# Patient Record
Sex: Female | Born: 1985 | Race: White | Hispanic: No | Marital: Single | Smoking: Current every day smoker
Health system: Southern US, Community
[De-identification: ages and names within clinical notes are randomized; demographics above are authoritative.]

## PROBLEM LIST (undated history)

## (undated) DIAGNOSIS — N189 Chronic kidney disease, unspecified: Secondary | ICD-10-CM

## (undated) DIAGNOSIS — L659 Nonscarring hair loss, unspecified: Secondary | ICD-10-CM

## (undated) HISTORY — PX: TUBAL LIGATION: SHX77

---

## 2005-09-03 ENCOUNTER — Emergency Department: Payer: Self-pay | Admitting: Emergency Medicine

## 2005-10-06 ENCOUNTER — Observation Stay: Payer: Self-pay

## 2005-10-10 ENCOUNTER — Observation Stay: Payer: Self-pay | Admitting: Obstetrics & Gynecology

## 2005-10-26 ENCOUNTER — Inpatient Hospital Stay: Payer: Self-pay | Admitting: Obstetrics & Gynecology

## 2006-03-02 ENCOUNTER — Emergency Department: Payer: Self-pay | Admitting: General Practice

## 2010-05-03 ENCOUNTER — Emergency Department: Payer: Self-pay | Admitting: Emergency Medicine

## 2012-04-29 ENCOUNTER — Emergency Department: Payer: Self-pay | Admitting: Emergency Medicine

## 2012-04-29 LAB — URINALYSIS, COMPLETE
Bilirubin,UR: NEGATIVE
Ketone: NEGATIVE
Ph: 6 (ref 4.5–8.0)
Specific Gravity: 1.013 (ref 1.003–1.030)

## 2012-04-29 LAB — COMPREHENSIVE METABOLIC PANEL WITH GFR
Albumin: 4.4 g/dL
Alkaline Phosphatase: 94 U/L
Anion Gap: 7
BUN: 11 mg/dL
Bilirubin,Total: 0.4 mg/dL
Calcium, Total: 8.7 mg/dL
Chloride: 109 mmol/L — ABNORMAL HIGH
Co2: 25 mmol/L
Creatinine: 0.83 mg/dL
EGFR (African American): >60
EGFR (Non-African Amer.): >60
Glucose: 82 mg/dL
Osmolality: 280
Potassium: 3.5 mmol/L
SGOT(AST): 22 U/L
SGPT (ALT): 23 U/L
Sodium: 141 mmol/L
Total Protein: 7.3 g/dL

## 2012-04-29 LAB — CBC
HCT: 43.3 % (ref 35.0–47.0)
HGB: 14.3 g/dL (ref 12.0–16.0)
MCH: 29.9 pg (ref 26.0–34.0)
MCHC: 33 g/dL (ref 32.0–36.0)
MCV: 91 fL (ref 80–100)
RBC: 4.77 10*6/uL (ref 3.80–5.20)

## 2014-01-11 ENCOUNTER — Emergency Department: Payer: Self-pay | Admitting: Emergency Medicine

## 2015-01-05 ENCOUNTER — Emergency Department: Payer: Self-pay | Admitting: Emergency Medicine

## 2015-02-13 ENCOUNTER — Ambulatory Visit
Admit: 2015-02-13 | Disposition: A | Discharge status: Home / Self Care | Payer: Self-pay | Attending: Obstetrics and Gynecology | Admitting: Obstetrics and Gynecology

## 2016-08-05 ENCOUNTER — Encounter: Payer: Self-pay | Admitting: Emergency Medicine

## 2016-08-05 ENCOUNTER — Emergency Department
Admission: EM | Admit: 2016-08-05 | Discharge: 2016-08-05 | Disposition: A | Discharge status: Home / Self Care | Payer: No Typology Code available for payment source | Attending: Emergency Medicine | Admitting: Emergency Medicine

## 2016-08-05 DIAGNOSIS — Y999 Unspecified external cause status: Secondary | ICD-10-CM | POA: Insufficient documentation

## 2016-08-05 DIAGNOSIS — Y9389 Activity, other specified: Secondary | ICD-10-CM | POA: Diagnosis not present

## 2016-08-05 DIAGNOSIS — N390 Urinary tract infection, site not specified: Secondary | ICD-10-CM

## 2016-08-05 DIAGNOSIS — F17200 Nicotine dependence, unspecified, uncomplicated: Secondary | ICD-10-CM | POA: Diagnosis not present

## 2016-08-05 DIAGNOSIS — Y92410 Unspecified street and highway as the place of occurrence of the external cause: Secondary | ICD-10-CM | POA: Diagnosis not present

## 2016-08-05 DIAGNOSIS — S39012A Strain of muscle, fascia and tendon of lower back, initial encounter: Secondary | ICD-10-CM | POA: Diagnosis not present

## 2016-08-05 DIAGNOSIS — R319 Hematuria, unspecified: Secondary | ICD-10-CM

## 2016-08-05 DIAGNOSIS — S3992XA Unspecified injury of lower back, initial encounter: Secondary | ICD-10-CM | POA: Diagnosis present

## 2016-08-05 LAB — URINALYSIS COMPLETE WITH MICROSCOPIC (ARMC ONLY)
Bilirubin Urine: NEGATIVE
Glucose, UA: NEGATIVE mg/dL
KETONES UR: NEGATIVE mg/dL
NITRITE: POSITIVE — AB
PH: 6 (ref 5.0–8.0)
Protein, ur: 100 mg/dL — AB
SPECIFIC GRAVITY, URINE: 1.012 (ref 1.005–1.030)

## 2016-08-05 MED ORDER — NITROFURANTOIN MONOHYD MACRO 100 MG PO CAPS: 100.0000 mg | ORAL_CAPSULE | Freq: Two times a day (BID) | ORAL | 0 refills | Status: AC
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
## 2017-07-04 ENCOUNTER — Emergency Department
Admission: EM | Admit: 2017-07-04 | Discharge: 2017-07-04 | Disposition: A | Discharge status: Home / Self Care | Payer: BLUE CROSS/BLUE SHIELD | Attending: Emergency Medicine | Admitting: Emergency Medicine

## 2017-07-04 ENCOUNTER — Encounter: Payer: Self-pay | Admitting: *Deleted

## 2017-07-04 DIAGNOSIS — R0981 Nasal congestion: Secondary | ICD-10-CM

## 2017-07-04 DIAGNOSIS — F17210 Nicotine dependence, cigarettes, uncomplicated: Secondary | ICD-10-CM | POA: Insufficient documentation

## 2017-07-04 DIAGNOSIS — J069 Acute upper respiratory infection, unspecified: Secondary | ICD-10-CM | POA: Insufficient documentation

## 2017-07-04 DIAGNOSIS — N189 Chronic kidney disease, unspecified: Secondary | ICD-10-CM | POA: Insufficient documentation

## 2017-07-04 HISTORY — DX: Chronic kidney disease, unspecified: N18.9

## 2017-07-04 MED ORDER — FLUTICASONE PROPIONATE 50 MCG/ACT NA SUSP: 1.0000 | Freq: Every day | NASAL | 0 refills | Status: AC

## 2017-07-04 MED ORDER — PSEUDOEPH-BROMPHEN-DM 30-2-10 MG/5ML PO SYRP: 5.0000 mL | ORAL_SOLUTION | Freq: Four times a day (QID) | ORAL | 0 refills | Status: AC | PRN

## 2017-07-04 NOTE — Discharge Instructions (Signed)
Please use Flonase as needed for nasal congestion. Please drink lots of fluids. Take cough medication, dextromethorphan with pseudoephedrine and brompheniramine. Return to the emergency department for any worsening cough, pain, fevers, urgent changes in her health.

## 2017-07-04 NOTE — ED Triage Notes (Signed)
PT to ED reporting congestion beginning yesterday and developing into generalized body aches today. PT reports joint aching and chest pains when coughing. PT reports coughing up clear mucus and also reports clear nasal drainage. Pt denies having checked temp at home but reports fever like symptoms. Pt also reports nausea without vomiting or diarrhea and generalized weakness x 2 days as well.

## 2017-07-04 NOTE — ED Provider Notes (Signed)
[facility]-EMERGENCY DEPARTMENT Provider Note   CSN: [id number] Arrival date & time: 07/04/17  1712     History   Chief Complaint Chief Complaint  Patient presents with  . Nasal Congestion  . Generalized Body Aches    HPI Victoria Mercer is a 31 y.o. female presents to the emergency department for evaluation of nasal congestion, body aches, nonproductive cough. Symptoms have been present for 1 day. She denies any chest pain or shortness of breath. No nausea vomiting abdominal pain or diarrhea. She has not taken any medications for her symptoms. No Tylenol or ibuprofen. She is tolerating by mouth well. She denies any sore throat, rashes, neck pain.  HPI  Past Medical History:  Diagnosis Date  . Chronic kidney disease     There are no active problems to display for this patient.   History reviewed. No pertinent surgical history.  OB History    No data available       Home Medications    Prior to Admission medications   Medication Sig Start Date End Date Taking? Authorizing Provider  brompheniramine-pseudoephedrine-DM 30-2-10 MG/5ML syrup Take 5 mLs by mouth 4 (four) times daily as needed. 07/04/17   Evon Slack, PA-C  fluticasone (FLONASE) 50 MCG/ACT nasal spray Place 1 spray into both nostrils daily. 07/04/17 07/04/18  Evon Slack, PA-C    Family History History reviewed. No pertinent family history.  Social History Social History  Substance Use Topics  . Smoking status: Current Every Day Smoker    Packs/day: 0.50    Types: Cigarettes  . Smokeless tobacco: Never Used  . Alcohol use No     Allergies   Patient has no known allergies.   Review of Systems Review of Systems  Constitutional: Negative for fever.  HENT: Positive for congestion, rhinorrhea, sinus pain and sinus pressure. Negative for ear pain, facial swelling, sore throat and trouble swallowing.   Respiratory: Positive for cough. Negative for shortness of breath.   Cardiovascular:  Negative for chest pain.  Gastrointestinal: Negative for abdominal pain, diarrhea, nausea and vomiting.  Genitourinary: Negative for difficulty urinating, dysuria and urgency.  Musculoskeletal: Negative for back pain and myalgias.  Skin: Negative for rash.  Neurological: Negative for dizziness and headaches.     Physical Exam Updated Vital Signs BP (!) 133/95 (BP Location: Left Arm)   Pulse (!) 101   Temp 99.1 F (37.3 C) (Oral)   Resp 16   Ht 5' (1.524 m)   Wt 49.9 kg (110 lb)   LMP 07/04/2017   SpO2 99%   BMI 21.48 kg/m   Physical Exam  Constitutional: She is oriented to person, place, and time. She appears well-developed and well-nourished. No distress.  HENT:  Head: Normocephalic and atraumatic.  Right Ear: External ear normal.  Left Ear: External ear normal.  Nose: Nose normal.  Mouth/Throat: Oropharynx is clear and moist. No oropharyngeal exudate.  Mild pharyngeal erythema with no exudates. Uvula is midline. No sign of peritonsillar abscess with midline shift. Positive tenderness along bilateral maxillary sinuses with no frontal sinus tenderness to palpation or percussion.  Eyes: Conjunctivae are normal. Right eye exhibits no discharge. Left eye exhibits no discharge.  Neck: Normal range of motion.  Cardiovascular: Normal rate.   Pulmonary/Chest: No respiratory distress.  Abdominal: Soft. She exhibits no mass. There is no tenderness. There is no guarding.  Musculoskeletal: Normal range of motion. She exhibits no deformity.  Lymphadenopathy:    She has cervical adenopathy.  Neurological: She is alert  and oriented to person, place, and time. She has normal reflexes.  Skin: Skin is warm and dry.  Psychiatric: She has a normal mood and affect. Her behavior is normal. Thought content normal.     ED Treatments / Results  Labs (all labs ordered are listed, but only abnormal results are displayed) Labs Reviewed - No data to display  EKG  EKG Interpretation None        Radiology No results found.  Procedures Procedures (including critical care time)  Medications Ordered in ED Medications - No data to display   Initial Impression / Assessment and Plan / ED Course  I have reviewed the triage vital signs and the nursing notes.  Pertinent labs & imaging results that were available during my care of the patient were reviewed by me and considered in my medical decision making (see chart for details).     31 year old female with upper respiratory infection. She complains of dry cough, nasal congestion. She will start Flonase. She is given Bromfed for cough. She will increase fluids. She will take Tylenol as needed for body aches and fevers. She will return to the ER for any worsening fevers, shortness of breath, worsening symptoms or changes in her health.  Final Clinical Impressions(s) / ED Diagnoses   Final diagnoses:  Viral upper respiratory tract infection  Nasal congestion    New Prescriptions New Prescriptions   BROMPHENIRAMINE-PSEUDOEPHEDRINE-DM 30-2-10 MG/5ML SYRUP    Take 5 mLs by mouth 4 (four) times daily as needed.   FLUTICASONE (FLONASE) 50 MCG/ACT NASAL SPRAY    Place 1 spray into both nostrils daily.     Evon SlackGaines, Thomas C, PA-C 07/04/17 1803    Rockne MenghiniNorman, Anne-Caroline, MD 07/04/17 2136

## 2017-11-16 ENCOUNTER — Other Ambulatory Visit: Payer: Self-pay

## 2017-11-16 ENCOUNTER — Emergency Department: Payer: BLUE CROSS/BLUE SHIELD

## 2017-11-16 ENCOUNTER — Emergency Department
Admission: EM | Admit: 2017-11-16 | Discharge: 2017-11-16 | Disposition: A | Discharge status: Home / Self Care | Payer: BLUE CROSS/BLUE SHIELD | Attending: Emergency Medicine | Admitting: Emergency Medicine

## 2017-11-16 DIAGNOSIS — N189 Chronic kidney disease, unspecified: Secondary | ICD-10-CM | POA: Insufficient documentation

## 2017-11-16 DIAGNOSIS — N12 Tubulo-interstitial nephritis, not specified as acute or chronic: Secondary | ICD-10-CM

## 2017-11-16 DIAGNOSIS — F17210 Nicotine dependence, cigarettes, uncomplicated: Secondary | ICD-10-CM | POA: Diagnosis not present

## 2017-11-16 DIAGNOSIS — R509 Fever, unspecified: Secondary | ICD-10-CM | POA: Diagnosis present

## 2017-11-16 HISTORY — DX: Nonscarring hair loss, unspecified: L65.9

## 2017-11-16 LAB — CBC
HEMATOCRIT: 45.8 % (ref 35.0–47.0)
Hemoglobin: 15.5 g/dL (ref 12.0–16.0)
MCH: 30.1 pg (ref 26.0–34.0)
MCHC: 33.7 g/dL (ref 32.0–36.0)
MCV: 89.2 fL (ref 80.0–100.0)
Platelets: 285 10*3/uL (ref 150–440)
RBC: 5.14 MIL/uL (ref 3.80–5.20)
RDW: 13 % (ref 11.5–14.5)
WBC: 14.7 10*3/uL — AB (ref 3.6–11.0)

## 2017-11-16 LAB — COMPREHENSIVE METABOLIC PANEL
ALT: 13 U/L — ABNORMAL LOW (ref 14–54)
ANION GAP: 10 (ref 5–15)
AST: 17 U/L (ref 15–41)
Albumin: 4.9 g/dL (ref 3.5–5.0)
Alkaline Phosphatase: 84 U/L (ref 38–126)
BUN: 9 mg/dL (ref 6–20)
CO2: 20 mmol/L — ABNORMAL LOW (ref 22–32)
Calcium: 9.2 mg/dL (ref 8.9–10.3)
Chloride: 107 mmol/L (ref 101–111)
Creatinine, Ser: 0.5 mg/dL (ref 0.44–1.00)
Glucose, Bld: 110 mg/dL — ABNORMAL HIGH (ref 65–99)
POTASSIUM: 3.7 mmol/L (ref 3.5–5.1)
Sodium: 137 mmol/L (ref 135–145)
Total Bilirubin: 1.1 mg/dL (ref 0.3–1.2)
Total Protein: 7.1 g/dL (ref 6.5–8.1)

## 2017-11-16 LAB — URINALYSIS, COMPLETE (UACMP) WITH MICROSCOPIC
BILIRUBIN URINE: NEGATIVE
Glucose, UA: NEGATIVE mg/dL
Hgb urine dipstick: NEGATIVE
Ketones, ur: 20 mg/dL — AB
Nitrite: NEGATIVE
Protein, ur: 30 mg/dL — AB
Specific Gravity, Urine: 1.016 (ref 1.005–1.030)
pH: 6 (ref 5.0–8.0)

## 2017-11-16 LAB — INFLUENZA PANEL BY PCR (TYPE A & B)
INFLBPCR: NEGATIVE
Influenza A By PCR: NEGATIVE

## 2017-11-16 LAB — POCT PREGNANCY, URINE: Preg Test, Ur: NEGATIVE

## 2017-11-16 LAB — LIPASE, BLOOD: LIPASE: 29 U/L (ref 11–51)

## 2017-11-16 MED ORDER — OXYCODONE-ACETAMINOPHEN 5-325 MG PO TABS: 2.0000 | ORAL_TABLET | Freq: Four times a day (QID) | ORAL | 0 refills | Status: AC | PRN

## 2017-11-16 MED ORDER — MORPHINE SULFATE (PF) 4 MG/ML IV SOLN
4.0000 mg | Freq: Once | INTRAVENOUS | Status: AC
  Administered 2017-11-16: 4 mg via INTRAVENOUS
  Filled 2017-11-16: qty 1

## 2017-11-16 MED ORDER — ONDANSETRON 4 MG PO TBDP: 4.0000 mg | ORAL_TABLET | Freq: Three times a day (TID) | ORAL | 0 refills | Status: AC | PRN

## 2017-11-16 MED ORDER — ONDANSETRON HCL 4 MG/2ML IJ SOLN
4.0000 mg | Freq: Once | INTRAMUSCULAR | Status: AC
  Administered 2017-11-16: 4 mg via INTRAVENOUS
  Filled 2017-11-16: qty 2

## 2017-11-16 MED ORDER — KETOROLAC TROMETHAMINE 30 MG/ML IJ SOLN
30.0000 mg | Freq: Once | INTRAMUSCULAR | Status: AC
  Administered 2017-11-16: 30 mg via INTRAVENOUS
  Filled 2017-11-16: qty 1

## 2017-11-16 MED ORDER — CEFTRIAXONE SODIUM IN DEXTROSE 20 MG/ML IV SOLN
1.0000 g | Freq: Once | INTRAVENOUS | Status: AC
Start: 2017-11-16 — End: 2017-11-16
  Administered 2017-11-16: 1 g via INTRAVENOUS
  Filled 2017-11-16: qty 50

## 2017-11-16 MED ORDER — CIPROFLOXACIN HCL 500 MG PO TABS: 500.0000 mg | ORAL_TABLET | Freq: Two times a day (BID) | ORAL | 0 refills | Status: AC

## 2017-11-16 MED ORDER — SODIUM CHLORIDE 0.9 % IV SOLN
Freq: Once | INTRAVENOUS | Status: AC
  Administered 2017-11-16: 20:00:00 via INTRAVENOUS

## 2017-11-16 NOTE — ED Triage Notes (Signed)
Pt arrives to ED with c/o N&V, dry heaves, fever (101 at home, didn't take anything). C/o of not being able to sleep. Alert, oriented, ambulatory. Symptoms x few days. States HA. States chronic kidney issue.

## 2017-11-16 NOTE — ED Provider Notes (Signed)
[facility] Emergency Department Provider Note       Time seen: ----------------------------------------- 7:52 PM on 11/16/2017 -----------------------------------------   I have reviewed the triage vital signs and the nursing notes.  HISTORY   Chief Complaint Fever and Emesis    HPI Victoria Mercer is a 32 y.o. female with a history of chronic kidney disease and kidney stones who presents to the ED for nausea, vomiting, fever and diffuse body aches.  She also reports not being able to sleep.  She reports history of frequent kidney stones and pyelonephritis.  She has had to have ureteral stents placed or nephrostomy tubes for frequent large stones with infections.  She is not currently having any back pain.  Past Medical History:  Diagnosis Date  . Alopecia   . Chronic kidney disease     There are no active problems to display for this patient.   Past Surgical History:  Procedure Laterality Date  . TUBAL LIGATION      Allergies Patient has no known allergies.  Social History Social History   Tobacco Use  . Smoking status: Current Every Day Smoker    Packs/day: 0.50    Types: Cigarettes  . Smokeless tobacco: Never Used  Substance Use Topics  . Alcohol use: No  . Drug use: Not on file    Review of Systems Constitutional: Positive for fever Eyes: Negative for vision changes ENT:  Negative for congestion, sore throat Cardiovascular: Negative for chest pain. Respiratory: Negative for shortness of breath. Gastrointestinal: Negative for abdominal pain, positive for vomiting Genitourinary: Negative for dysuria. Musculoskeletal: Positive for diffuse myalgias Skin: Negative for rash. Neurological: Negative for headaches, focal weakness or numbness.  All systems negative/normal/unremarkable except as stated in the HPI  ____________________________________________   PHYSICAL EXAM:  VITAL SIGNS: ED Triage Vitals [11/16/17 1750]  Enc  Vitals Group     BP 138/83     Pulse Rate 98     Resp 18     Temp 98.6 F (37 C)     Temp Source Oral     SpO2 100 %     Weight      Height      Head Circumference      Peak Flow      Pain Score 10     Pain Loc      Pain Edu?      Excl. in GC?    Constitutional: Alert and oriented. Well appearing and in no distress. Eyes: Conjunctivae are normal. Normal extraocular movements. ENT   Head: Normocephalic and atraumatic.   Nose: No congestion/rhinnorhea.   Mouth/Throat: Mucous membranes are moist.   Neck: No stridor. Cardiovascular: Normal rate, regular rhythm. No murmurs, rubs, or gallops. Respiratory: Normal respiratory effort without tachypnea nor retractions. Breath sounds are clear and equal bilaterally. No wheezes/rales/rhonchi. Gastrointestinal: Soft and nontender. Normal bowel sounds Musculoskeletal: Nontender with normal range of motion in extremities. No lower extremity tenderness nor edema. Neurologic:  Normal speech and language. No gross focal neurologic deficits are appreciated.  Skin:  Skin is warm, dry and intact. No rash noted. Psychiatric: Mood and affect are normal. Speech and behavior are normal.  ___________________________________________  ED COURSE:  As part of my medical decision making, I reviewed the following data within the electronic MEDICAL RECORD NUMBER History obtained from family if available, nursing notes, old chart and ekg, as well as notes from prior ED visits. Patient presented for flulike symptoms, we will assess with labs and imaging  as indicated at this time.   Procedures ____________________________________________   LABS (pertinent positives/negatives)  Labs Reviewed  COMPREHENSIVE METABOLIC PANEL - Abnormal; Notable for the following components:      Result Value   CO2 20 (*)    Glucose, Bld 110 (*)    ALT 13 (*)    All other components within normal limits  CBC - Abnormal; Notable for the following components:   WBC  14.7 (*)    All other components within normal limits  URINALYSIS, COMPLETE (UACMP) WITH MICROSCOPIC - Abnormal; Notable for the following components:   Color, Urine YELLOW (*)    APPearance HAZY (*)    Ketones, ur 20 (*)    Protein, ur 30 (*)    Leukocytes, UA SMALL (*)    Bacteria, UA RARE (*)    Squamous Epithelial / LPF 6-30 (*)    All other components within normal limits  LIPASE, BLOOD  INFLUENZA PANEL BY PCR (TYPE A & B)  POC URINE PREG, ED  POCT PREGNANCY, URINE    RADIOLOGY Images were viewed by me  KUB/Ct renal protocol IMPRESSION: Multiple right renal calculi. Right renal caliectasis, particularly in the right kidney upper pole. Evidence for scarring and cortical thinning in the right kidney. Mild dilatation of the right renal pelvis without an obstructing ureter stone.  Few tiny nodules at the left lung base. These are likely postinflammatory in a patient of this age.  Small adrenal adenomas. ____________________________________________  DIFFERENTIAL DIAGNOSIS   Influenza, sepsis, pyelonephritis, UTI, renal colic  FINAL ASSESSMENT AND PLAN  Pyelonephritis   Plan: Patient had presented for flulike symptoms. Patient's labs did reveal leukocytosis and likely UTI. Patient's imaging did not reveal any active or concerning kidney stones.  She was given IV Rocephin and we will send a urine culture.  She will be discharged with Cipro and pain medication as well as antiemetics.  She is stable for outpatient follow-up.   Emily Filbert, MD   Note: This note was generated in part or whole with voice recognition software. Voice recognition is usually quite accurate but there are transcription errors that can and very often do occur. I apologize for any typographical errors that were not detected and corrected.     Emily Filbert, MD 11/16/17 2110

## 2017-11-16 NOTE — ED Notes (Signed)
Patient transported to CT 

## 2017-11-18 LAB — URINE CULTURE
Culture: NO GROWTH
Special Requests: NORMAL

## 2018-07-23 ENCOUNTER — Other Ambulatory Visit: Payer: Self-pay

## 2018-07-23 ENCOUNTER — Emergency Department
Admission: EM | Admit: 2018-07-23 | Discharge: 2018-07-23 | Disposition: A | Discharge status: Home / Self Care | Payer: BLUE CROSS/BLUE SHIELD | Attending: Emergency Medicine | Admitting: Emergency Medicine

## 2018-07-23 DIAGNOSIS — Z5321 Procedure and treatment not carried out due to patient leaving prior to being seen by health care provider: Secondary | ICD-10-CM | POA: Insufficient documentation

## 2018-07-23 DIAGNOSIS — R1084 Generalized abdominal pain: Secondary | ICD-10-CM | POA: Diagnosis present

## 2018-07-23 LAB — COMPREHENSIVE METABOLIC PANEL
ALBUMIN: 4.6 g/dL (ref 3.5–5.0)
ALT: 12 U/L (ref 0–44)
AST: 16 U/L (ref 15–41)
Alkaline Phosphatase: 68 U/L (ref 38–126)
Anion gap: 7 (ref 5–15)
BUN: 14 mg/dL (ref 6–20)
CHLORIDE: 108 mmol/L (ref 98–111)
CO2: 24 mmol/L (ref 22–32)
CREATININE: 0.66 mg/dL (ref 0.44–1.00)
Calcium: 8.9 mg/dL (ref 8.9–10.3)
GFR calc Af Amer: >60 mL/min (ref >60)
GFR calc non Af Amer: >60 mL/min (ref >60)
GLUCOSE: 102 mg/dL — AB (ref 70–99)
Potassium: 3.5 mmol/L (ref 3.5–5.1)
SODIUM: 139 mmol/L (ref 135–145)
Total Bilirubin: 0.9 mg/dL (ref 0.3–1.2)
Total Protein: 6.9 g/dL (ref 6.5–8.1)

## 2018-07-23 LAB — URINALYSIS, COMPLETE (UACMP) WITH MICROSCOPIC
BILIRUBIN URINE: NEGATIVE
Glucose, UA: NEGATIVE mg/dL
KETONES UR: NEGATIVE mg/dL
Nitrite: NEGATIVE
PROTEIN: 30 mg/dL — AB
SPECIFIC GRAVITY, URINE: 1.018 (ref 1.005–1.030)
pH: 5 (ref 5.0–8.0)

## 2018-07-23 LAB — CBC
HCT: 45.7 % (ref 35.0–47.0)
Hemoglobin: 16 g/dL (ref 12.0–16.0)
MCH: 31.5 pg (ref 26.0–34.0)
MCHC: 35 g/dL (ref 32.0–36.0)
MCV: 90.1 fL (ref 80.0–100.0)
Platelets: 277 10*3/uL (ref 150–440)
RBC: 5.08 MIL/uL (ref 3.80–5.20)
RDW: 13.1 % (ref 11.5–14.5)
WBC: 9 10*3/uL (ref 3.6–11.0)

## 2018-07-23 LAB — POCT PREGNANCY, URINE: Preg Test, Ur: NEGATIVE

## 2018-07-23 NOTE — ED Notes (Signed)
Pt asked about wait time. This Clinical research associate explained that there are two people in front of you, but there is no exact time that I could give to pt. Pt stated " my sister is a Doctor here and I will be complaining to her about the wait time. I have a kidney disease, and I should not have to wait this long". This Clinical research associate explain stated to pt that we are working on getting people back and asked is there anything else she could do and pt stated no and walked off.

## 2018-07-23 NOTE — ED Notes (Signed)
Pt up to stat desk stating that she "has a chronic kidney condition and should not wait". Pt states that she has been here over two hours and would have just gone to [facility] where her Dr's are at. This RN informed pt that she has been here 1 hour and 9 minutes and that we would be taking her to a room next. Pt told this RN that this is "fucking ridiculous" and my "sister's a dr here". Pt told partner that he needs to take her to [facility]. Pt and partner left ED at this time. Pt ambulatory with steady gait and in NAD.

## 2018-07-23 NOTE — ED Triage Notes (Signed)
Pt states right flank pain and vaginal pain that woke her up from sleep this am. Pt with history of renal calculi. Pt denies known hematuria, fever.

## 2019-07-12 IMAGING — CT CT RENAL STONE PROTOCOL
3 of 4 series · 8 of 46 positions shown, 15 images · non-contrast
Comparison: 01/06/2015

CLINICAL DATA: Flank pain and fevers.

EXAM:
CT ABDOMEN AND PELVIS WITHOUT CONTRAST
TECHNIQUE: Multidetector CT imaging of the abdomen and pelvis was performed
following the standard protocol without IV contrast.

[Series 4: lung bases · axial · 0.64mm/px · z∈[+22,+77]mm · 4 of 19 slices shown, 9 images]
[im 4/19  soft-tissue]
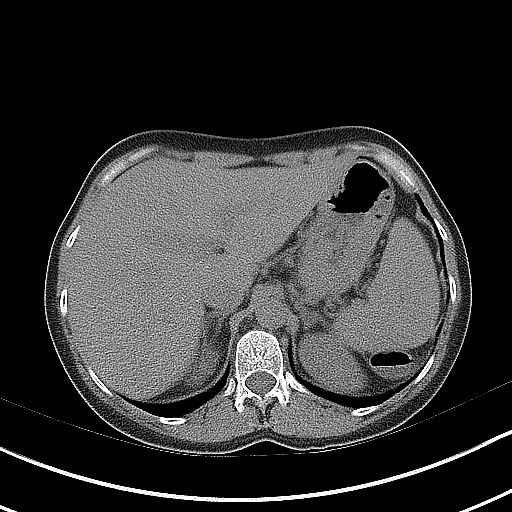
[im 4/19  lung]
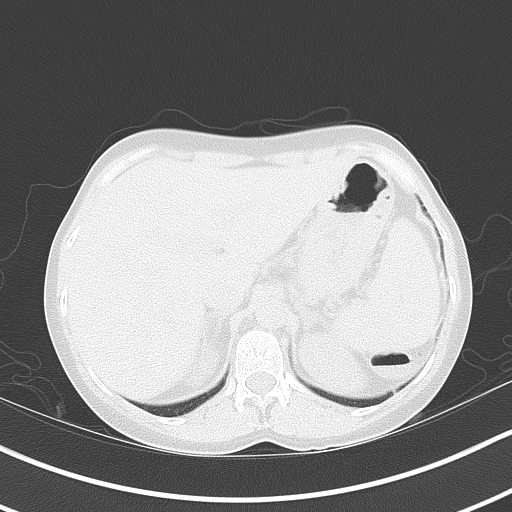
[im 4/19  bone]
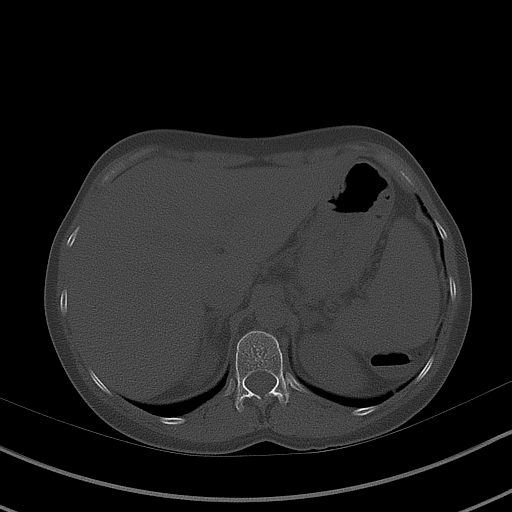
[im 8/19  soft-tissue]
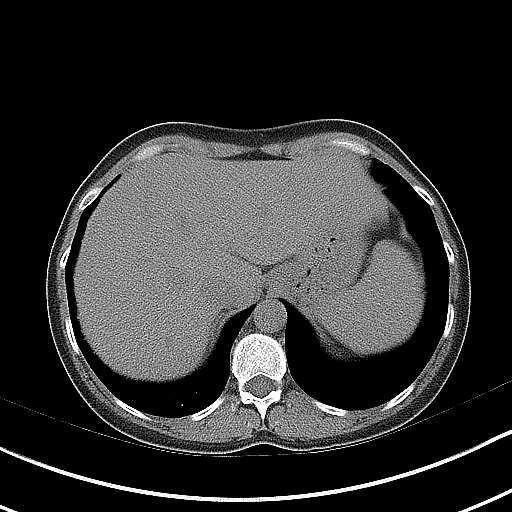
[im 8/19  lung]
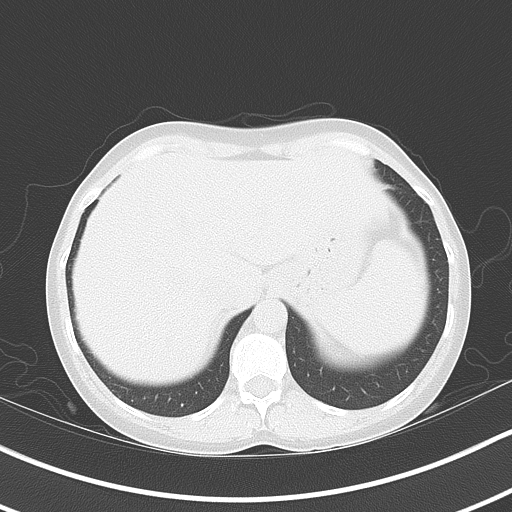
[im 11/19  soft-tissue]
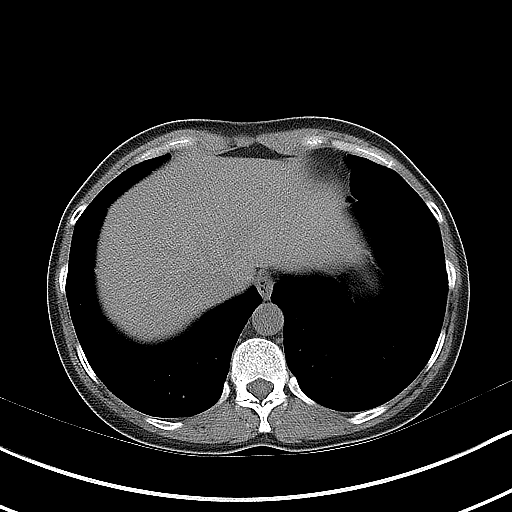
[im 11/19  lung]
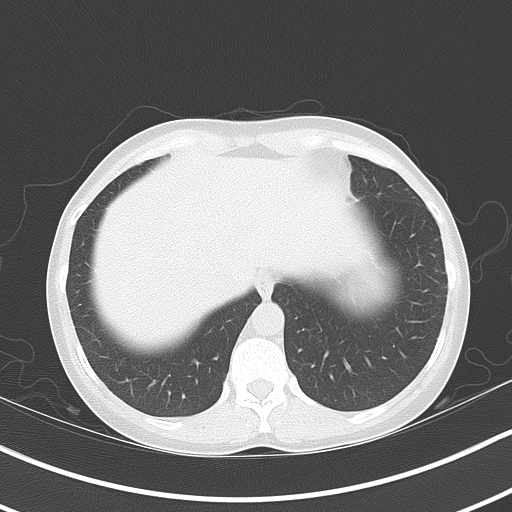
[im 15/19  soft-tissue]
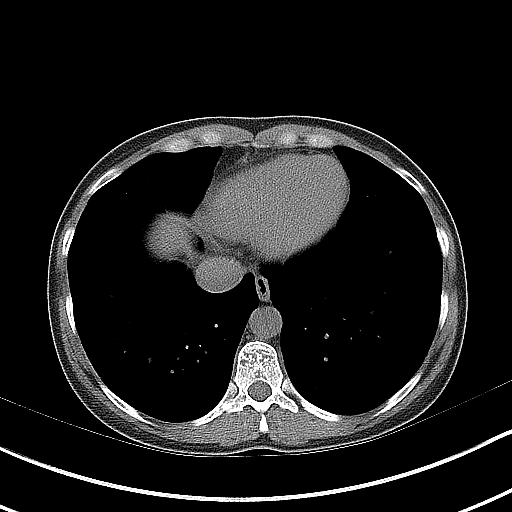
[im 15/19  lung]
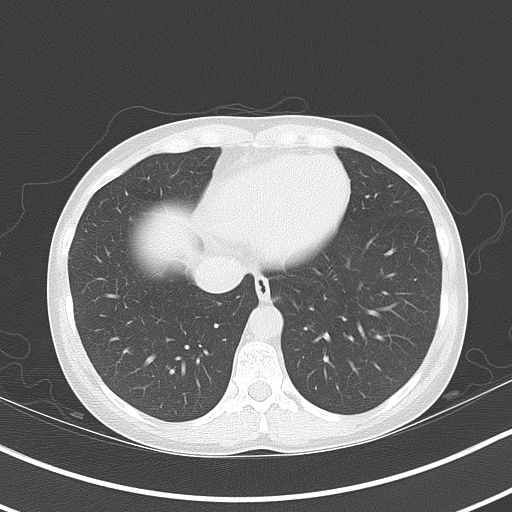

[Series 5: coronal · coronal · 0.70mm/px · 3 of 102 slices shown, 4 images]
[im 34/102  soft-tissue]
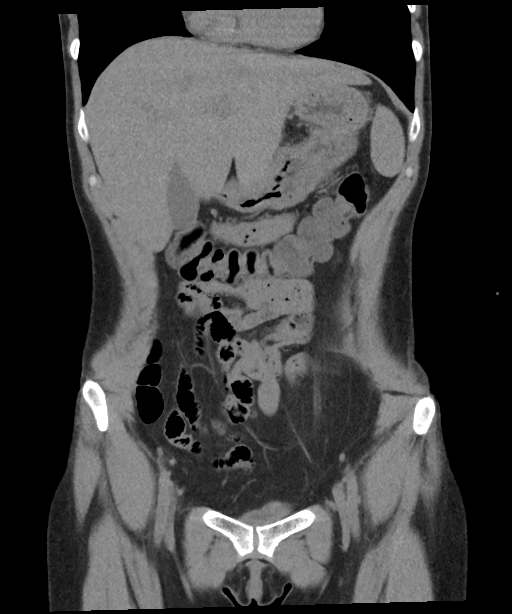
[im 45/102  soft-tissue]
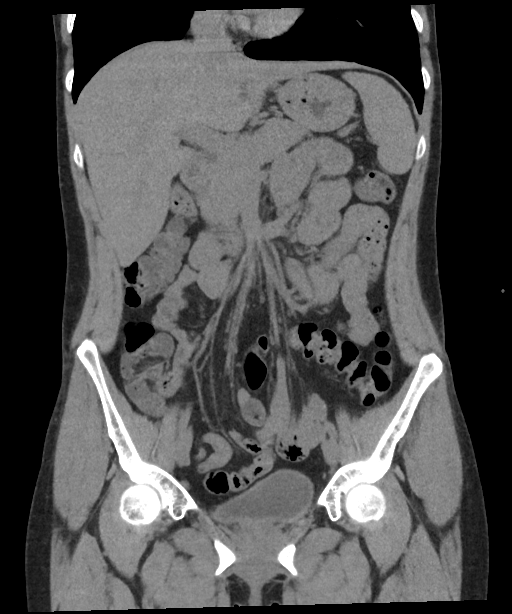
[im 45/102  bone]
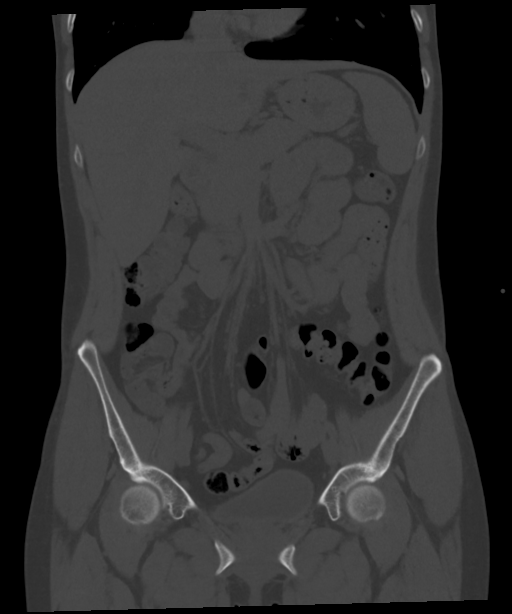
[im 57/102  soft-tissue]
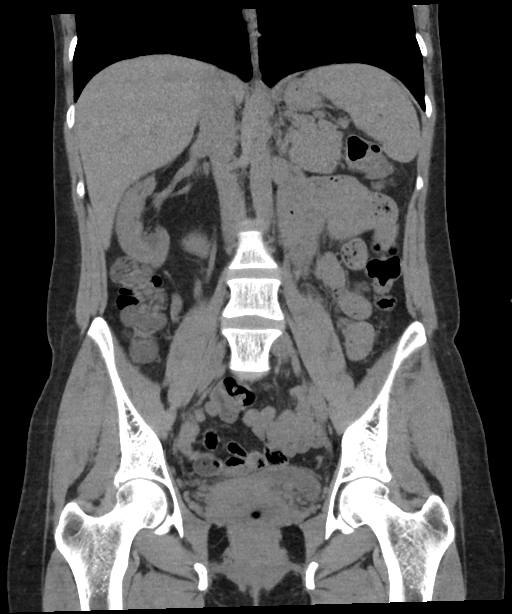

[Series 6: sagittal · sagittal · 0.43mm/px · 1 of 144 slices shown, 2 images]
[im 48/144  soft-tissue]
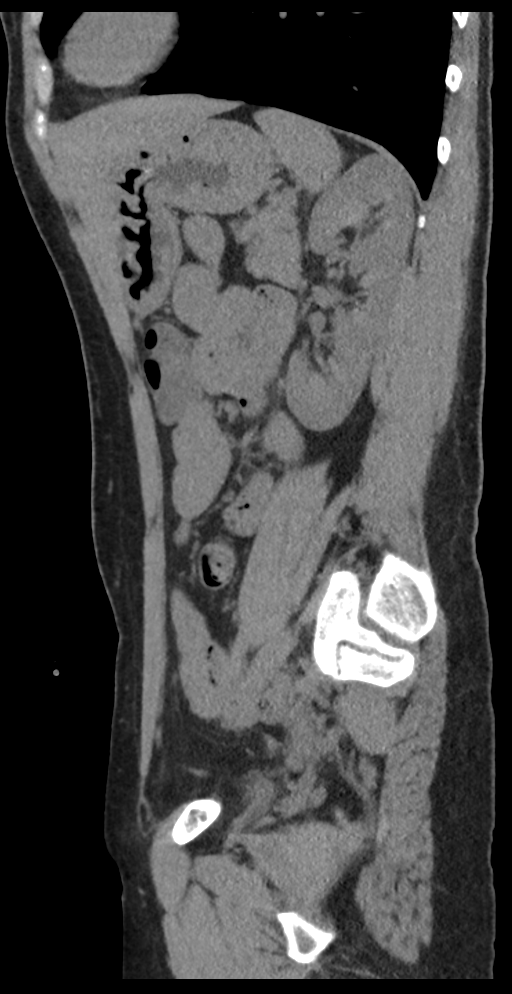
[im 48/144  bone]
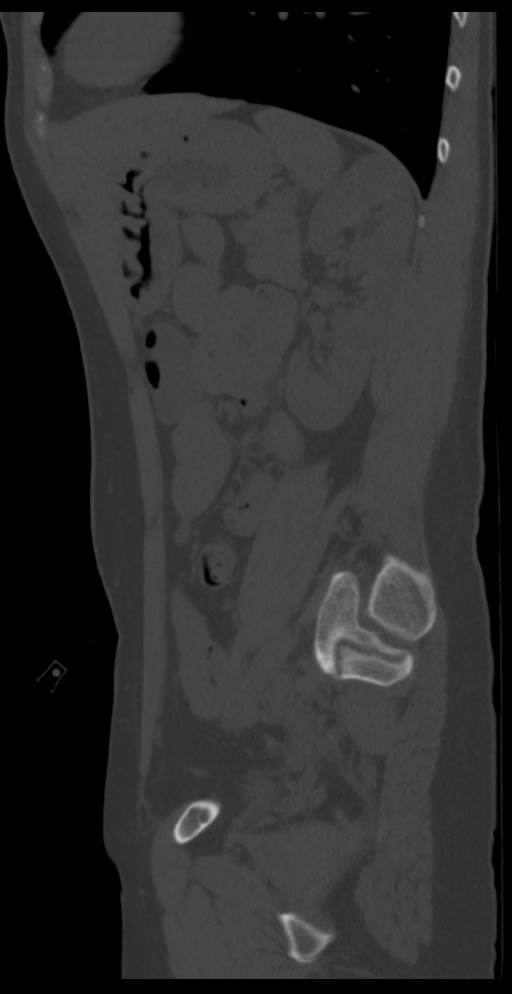

[8 of 46 positions shown; findings below may reference images not displayed]

FINDINGS: Lower chest: Punctate nodular densities at the left lung base are
likely post inflammatory in a patient of this age. No pleural
effusions.

Hepatobiliary: Normal appearance of the liver and gallbladder.
Probable 9 mm cyst in the inferior right hepatic lobe.

Pancreas: Normal appearance of the pancreas without inflammation or
duct dilatation.

Spleen: Normal appearance of spleen without enlargement.

Adrenals/Urinary Tract: Evidence for small adenomas in bilateral
adrenal glands. Normal appearance of left kidney and urinary
bladder. Multiple right renal stones. Largest stone is in the upper
infundibulum measuring 1.6 cm. Multiple large stones in the right
kidney lower pole. There is scarring and areas of cortical thinning
in the right kidney. Caliectasis is most prominent in the upper
pole. Upper pole calices are likely obstructed by the 1.6 cm
infundibular stone. Mild dilatation of the right renal pelvis. No
significant perinephric edema. The right kidney stone burden has
markedly increased since 1406. No right ureter stones.

Stomach/Bowel: Stomach is within normal limits. Appendix appears
normal. No evidence of bowel wall thickening, distention, or
inflammatory changes.

Vascular/Lymphatic: No significant vascular findings are present. No
enlarged abdominal or pelvic lymph nodes.

Reproductive: Retroverted uterus. Again noted is a oval shaped
low-density structure in the right lower uterine segment region.
This could represent a large nabothian cyst. Question a small right
ovarian cyst.

Other: No free fluid.  Negative for free air.

Musculoskeletal: No acute bone abnormality.
IMPRESSION: Multiple right renal calculi. Right renal caliectasis, particularly
in the right kidney upper pole. Evidence for scarring and cortical
thinning in the right kidney. Mild dilatation of the right renal
pelvis without an obstructing ureter stone.

Few tiny nodules at the left lung base. These are likely
postinflammatory in a patient of this age.

Small adrenal adenomas.

## 2019-07-12 IMAGING — CR DG ABDOMEN 1V
1 series · 1 of 1 positions shown · non-contrast
Comparison: CT Abdomen and Pelvis 01/06/2015.

CLINICAL DATA: 31-year-old female with fever and hematuria.
Personal history of kidney stones.

EXAM:
ABDOMEN - 1 VIEW

[abdomen kub]
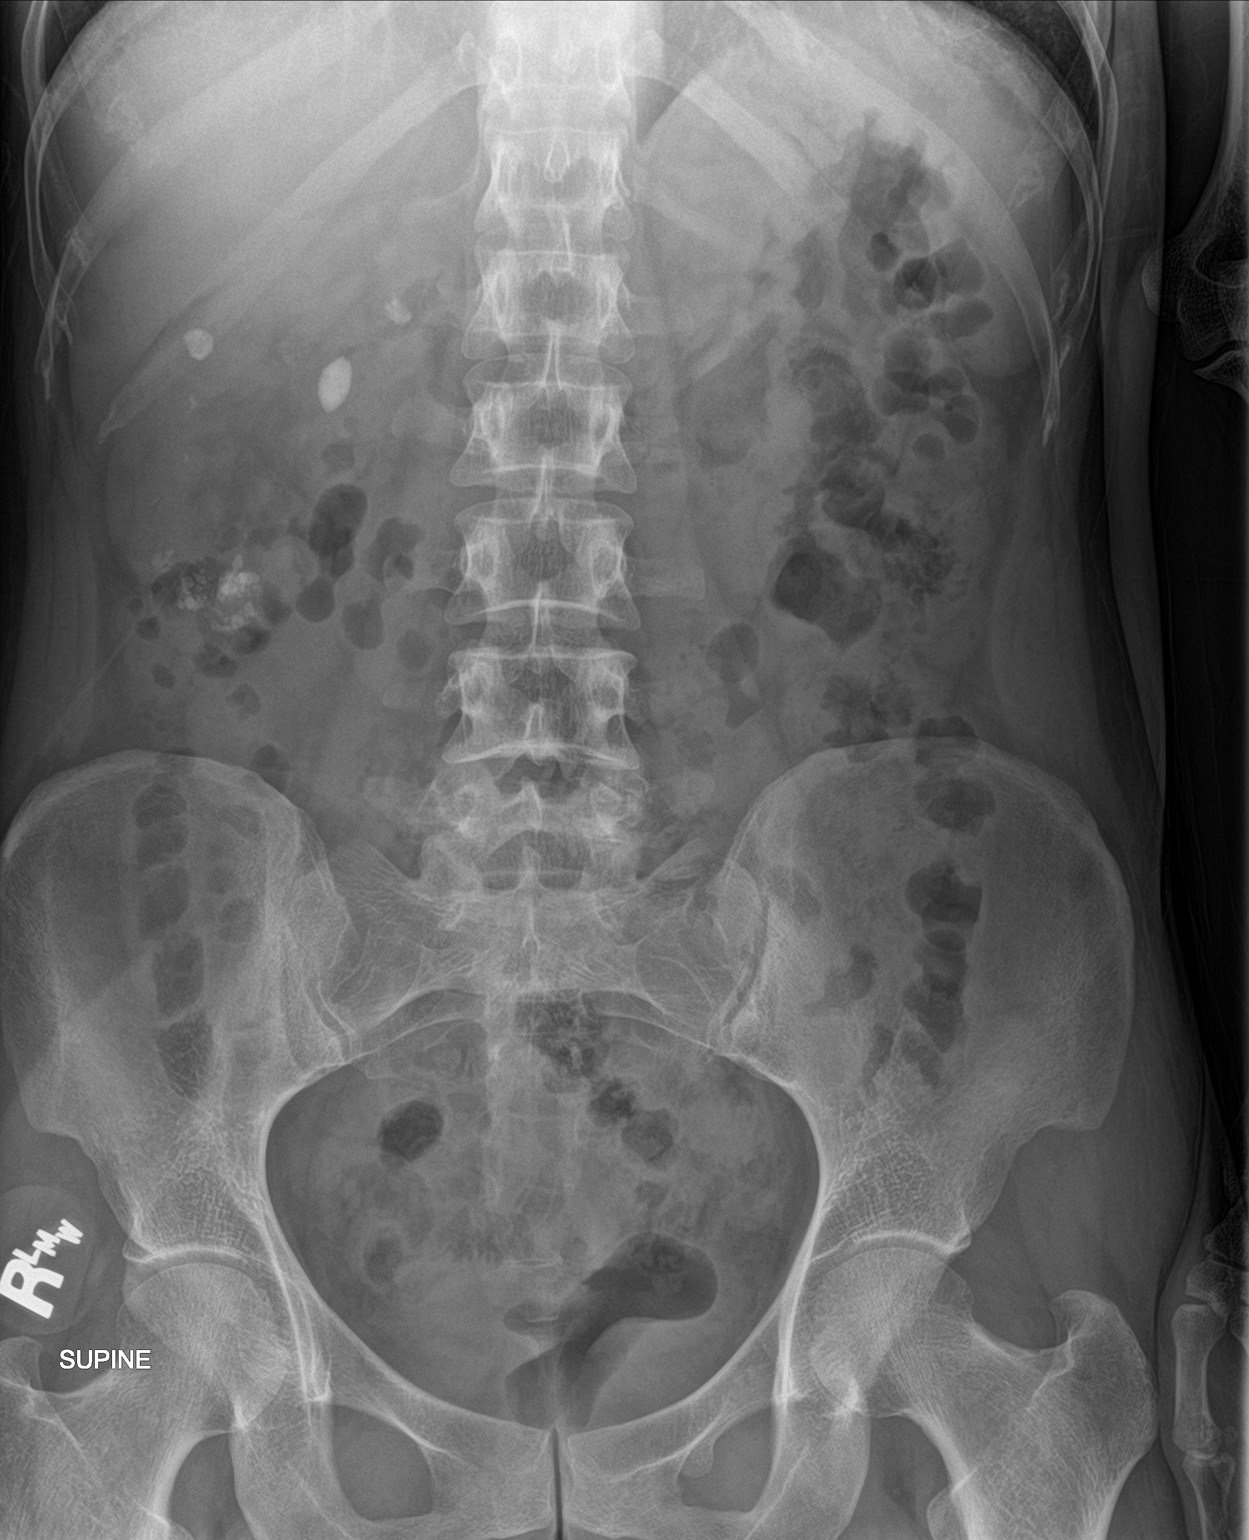

[1 of 1 positions shown; findings below may reference images not displayed]

FINDINGS: Abnormal appearance of the right kidney on the 3588 CT comparison.
Since that time substantially increased calcifications have
developed in an around the right renal shadow, including a large 8 x
15 millimeter oval calculus projecting at the expected level of the
right renal hilum. Confluent lower pole region calcifications might
be scarring related nephrocalcinosis rather than in the collecting
system.

No left nephrolithiasis or other urologic calculus identified. Non
obstructed bowel gas pattern. Negative visible lung bases. No acute
osseous abnormality identified.
IMPRESSION: 1. Substantially progressed abnormal calcifications projecting at
the level of the right kidney, including a large 8 x 15 millimeter
oval calculus at the expected location of the right renal pelvis.
Consider acute obstructive uropathy. Noncontrast CT Abdomen and
Pelvis would evaluate further.
2. Normal bowel gas pattern.
